# Patient Record
Sex: Male | Born: 1950
Health system: Southern US, Community
[De-identification: ages and names within clinical notes are randomized; demographics above are authoritative.]

## PROBLEM LIST (undated history)

## (undated) DIAGNOSIS — M75122 Complete rotator cuff tear or rupture of left shoulder, not specified as traumatic: Secondary | ICD-10-CM

## (undated) DIAGNOSIS — M7502 Adhesive capsulitis of left shoulder: Secondary | ICD-10-CM

## (undated) DIAGNOSIS — N400 Enlarged prostate without lower urinary tract symptoms: Secondary | ICD-10-CM

## (undated) DIAGNOSIS — I10 Essential (primary) hypertension: Secondary | ICD-10-CM

## (undated) HISTORY — DX: Benign prostatic hyperplasia without lower urinary tract symptoms: N40.0

## (undated) HISTORY — PX: BACK SURGERY: SHX140

---

## 2007-08-10 ENCOUNTER — Encounter: Admission: RE | Admit: 2007-08-10 | Discharge: 2007-08-10 | Payer: Self-pay | Admitting: Neurosurgery

## 2007-08-23 ENCOUNTER — Ambulatory Visit (HOSPITAL_COMMUNITY): Admission: RE | Admit: 2007-08-23 | Discharge: 2007-08-24 | Payer: Self-pay | Admitting: Neurosurgery

## 2010-12-19 ENCOUNTER — Encounter: Payer: Self-pay | Admitting: Neurosurgery

## 2011-04-11 NOTE — Op Note (Signed)
NAME:  Christopher Maldonado, Christopher Maldonado NO.:  [id number]   MEDICAL RECORD NO.:  [id number]          PATIENT TYPE:  AMB   LOCATION:  SDS                          FACILITY:  [facility]   PHYSICIAN:  Henry A. Pool, M.D.    DATE OF BIRTH:  [birth year]   DATE OF PROCEDURE:  08/23/2007  DATE OF DISCHARGE:                               OPERATIVE REPORT   SERVICE:  Neurosurgery.   PREOPERATIVE DIAGNOSIS:  Right L5-S1 herniated pulposus with  radiculopathy.   POSTOPERATIVE DIAGNOSIS:  Right L5-S1 herniated pulposus with  radiculopathy.   PROCEDURE:  Right L5-S1 laminotomy and microdiskectomy.   SURGEON:  Kathaleen Maser. Pool, MD   ASSISTANT:  Tia Alert, MD   ANESTHESIA:  General endotracheal.   INDICATIONS:  Mr. Cranshaw is a 60 year old male with a history of back and  right lower extremity pain, paresthesia and weakness consistent with a  right-sided S1 radiculopathy.  Workup demonstrates evidence of focal  right-sided L5-S1 disk herniation with compression of the right-sided S1  nerve root.  Patient has failed conservative management and presents now  for laminotomy and microdiskectomy in hopes of improving his symptoms.   OPERATIVE NOTE:  The patient was brought to the operating room and  placed on the table in a supine position.  After an adequate level of  anesthesia was achieved, the patient was positioned prone on a Wilson  frame and appropriately padded.  The patient's lumbar region was prepped  and draped sterilely.  A 10 blade was then used to make a linear  incision overlying the L5-S1 interspace.  This was carried down sharply  in midline.  Subperiosteal dissection was performed, exposing the lamina  and facet joints at L5 and S1 on the right side.  A deep self-retaining  retractor was placed and intraoperative x-rays taken and the level was  confirmed.  A laminotomy was then performed using a high-speed drill and  Kerrison rongeurs to remove the inferior aspect of the lamina  of L5,  medial aspect of L5-S1 facet joint and superior rim of the S1 lamina.  The ligament flavum was then elevated and resected in piecemeal fashion  using Kerrison rongeur rongeurs.  The underlying thecal sac and exiting  S1 nerve root were identified.  The microscopic was then brought into  the field and used for microdissection of the right-sided S1 nerve root  and underlying disk herniation.  The epidural venous plexus was  coagulated and cut.  Thecal sac and S1 nerve root were gently mobilized  and tracked towards the midline.  Disk herniation was readily apparent  with a moderately large free fragment causing marked compression of the  right-sided S1 nerve root.  This was dissected free and completely  resected using blunt nerve hooks and pituitary rongeurs.  The residual  disk space itself was flat.  There was a small annular defect, but no  evidence of significantly loose or threateningly degenerative disk  protruding from the annular tear; for this reason, a full annulotomy was  not performed and an intradiskal diskectomy was not performed.  The  wound was  then irrigated with antibiotic solution.  Gelfoam was placed  topically for hemostasis, which was found to be good.  Microscope and  retractor system were removed.  Hemostasis in the  muscle was achieved with electrocautery.  The wound was then closed in  layers with Vicryl sutures.  Steri-Strips and sterile dressings were  applied.  There were no apparent complications.  The patient tolerated  the procedure well and he returns to the recovery room postoperatively.           ______________________________  Kathaleen Maser Pool, M.D.     HAP/MEDQ  D:  08/23/2007  T:  08/24/2007  Job:  [id number]

## 2011-09-07 LAB — CBC
HCT: 46
Hemoglobin: 15.7
MCHC: 34.1
Platelets: 154
RDW: 12.6

## 2011-09-07 LAB — DIFFERENTIAL
Basophils Absolute: 0
Basophils Relative: 0
Eosinophils Relative: 4
Monocytes Absolute: 0.5
Neutro Abs: 3.9

## 2014-11-24 ENCOUNTER — Other Ambulatory Visit: Payer: Self-pay | Admitting: Family Medicine

## 2014-11-24 DIAGNOSIS — R17 Unspecified jaundice: Secondary | ICD-10-CM

## 2014-11-25 ENCOUNTER — Other Ambulatory Visit: Payer: Self-pay

## 2014-11-27 HISTORY — PX: HERNIA REPAIR: SHX51

## 2016-09-13 DIAGNOSIS — D229 Melanocytic nevi, unspecified: Secondary | ICD-10-CM | POA: Diagnosis not present

## 2016-09-13 DIAGNOSIS — N400 Enlarged prostate without lower urinary tract symptoms: Secondary | ICD-10-CM | POA: Diagnosis not present

## 2016-09-13 DIAGNOSIS — Z0000 Encounter for general adult medical examination without abnormal findings: Secondary | ICD-10-CM | POA: Diagnosis not present

## 2016-09-13 DIAGNOSIS — I10 Essential (primary) hypertension: Secondary | ICD-10-CM | POA: Diagnosis not present

## 2016-09-13 DIAGNOSIS — E785 Hyperlipidemia, unspecified: Secondary | ICD-10-CM | POA: Diagnosis not present

## 2016-10-12 DIAGNOSIS — D225 Melanocytic nevi of trunk: Secondary | ICD-10-CM | POA: Diagnosis not present

## 2016-10-12 DIAGNOSIS — L814 Other melanin hyperpigmentation: Secondary | ICD-10-CM | POA: Diagnosis not present

## 2016-10-12 DIAGNOSIS — D485 Neoplasm of uncertain behavior of skin: Secondary | ICD-10-CM | POA: Diagnosis not present

## 2016-10-12 DIAGNOSIS — L821 Other seborrheic keratosis: Secondary | ICD-10-CM | POA: Diagnosis not present

## 2016-10-12 DIAGNOSIS — L570 Actinic keratosis: Secondary | ICD-10-CM | POA: Diagnosis not present

## 2016-11-14 DIAGNOSIS — D485 Neoplasm of uncertain behavior of skin: Secondary | ICD-10-CM | POA: Diagnosis not present

## 2016-11-15 DIAGNOSIS — D225 Melanocytic nevi of trunk: Secondary | ICD-10-CM | POA: Diagnosis not present

## 2016-11-15 DIAGNOSIS — L900 Lichen sclerosus et atrophicus: Secondary | ICD-10-CM | POA: Diagnosis not present

## 2017-10-03 DIAGNOSIS — N400 Enlarged prostate without lower urinary tract symptoms: Secondary | ICD-10-CM | POA: Diagnosis not present

## 2017-10-03 DIAGNOSIS — I10 Essential (primary) hypertension: Secondary | ICD-10-CM | POA: Diagnosis not present

## 2017-10-11 DIAGNOSIS — D225 Melanocytic nevi of trunk: Secondary | ICD-10-CM | POA: Diagnosis not present

## 2017-10-11 DIAGNOSIS — Z872 Personal history of diseases of the skin and subcutaneous tissue: Secondary | ICD-10-CM | POA: Diagnosis not present

## 2017-10-11 DIAGNOSIS — L814 Other melanin hyperpigmentation: Secondary | ICD-10-CM | POA: Diagnosis not present

## 2017-10-11 DIAGNOSIS — L570 Actinic keratosis: Secondary | ICD-10-CM | POA: Diagnosis not present

## 2018-04-08 DIAGNOSIS — E785 Hyperlipidemia, unspecified: Secondary | ICD-10-CM | POA: Diagnosis not present

## 2018-04-08 DIAGNOSIS — I10 Essential (primary) hypertension: Secondary | ICD-10-CM | POA: Diagnosis not present

## 2018-04-08 DIAGNOSIS — Z125 Encounter for screening for malignant neoplasm of prostate: Secondary | ICD-10-CM | POA: Diagnosis not present

## 2018-04-11 DIAGNOSIS — E785 Hyperlipidemia, unspecified: Secondary | ICD-10-CM | POA: Diagnosis not present

## 2018-04-11 DIAGNOSIS — I10 Essential (primary) hypertension: Secondary | ICD-10-CM | POA: Diagnosis not present

## 2018-04-11 DIAGNOSIS — Z23 Encounter for immunization: Secondary | ICD-10-CM | POA: Diagnosis not present

## 2018-04-11 DIAGNOSIS — Z0000 Encounter for general adult medical examination without abnormal findings: Secondary | ICD-10-CM | POA: Diagnosis not present

## 2018-04-11 DIAGNOSIS — N400 Enlarged prostate without lower urinary tract symptoms: Secondary | ICD-10-CM | POA: Diagnosis not present

## 2018-06-17 DIAGNOSIS — H52203 Unspecified astigmatism, bilateral: Secondary | ICD-10-CM | POA: Diagnosis not present

## 2018-06-17 DIAGNOSIS — H524 Presbyopia: Secondary | ICD-10-CM | POA: Diagnosis not present

## 2018-06-17 DIAGNOSIS — H2513 Age-related nuclear cataract, bilateral: Secondary | ICD-10-CM | POA: Diagnosis not present

## 2018-12-04 DIAGNOSIS — M5412 Radiculopathy, cervical region: Secondary | ICD-10-CM | POA: Diagnosis not present

## 2018-12-12 ENCOUNTER — Other Ambulatory Visit: Payer: Self-pay | Admitting: Family Medicine

## 2018-12-12 DIAGNOSIS — M542 Cervicalgia: Secondary | ICD-10-CM | POA: Diagnosis not present

## 2018-12-12 DIAGNOSIS — M5410 Radiculopathy, site unspecified: Secondary | ICD-10-CM

## 2018-12-12 DIAGNOSIS — R29898 Other symptoms and signs involving the musculoskeletal system: Secondary | ICD-10-CM | POA: Diagnosis not present

## 2018-12-13 ENCOUNTER — Other Ambulatory Visit: Payer: Self-pay

## 2018-12-16 ENCOUNTER — Ambulatory Visit
Admission: RE | Admit: 2018-12-16 | Discharge: 2018-12-16 | Disposition: A | Discharge status: Home / Self Care | Payer: BLUE CROSS/BLUE SHIELD | Source: Ambulatory Visit | Attending: Family Medicine | Admitting: Family Medicine

## 2018-12-16 DIAGNOSIS — M4802 Spinal stenosis, cervical region: Secondary | ICD-10-CM | POA: Diagnosis not present

## 2018-12-16 DIAGNOSIS — M5410 Radiculopathy, site unspecified: Secondary | ICD-10-CM

## 2018-12-25 DIAGNOSIS — M4802 Spinal stenosis, cervical region: Secondary | ICD-10-CM | POA: Diagnosis not present

## 2018-12-25 DIAGNOSIS — I10 Essential (primary) hypertension: Secondary | ICD-10-CM | POA: Diagnosis not present

## 2018-12-25 DIAGNOSIS — Z01818 Encounter for other preprocedural examination: Secondary | ICD-10-CM | POA: Diagnosis not present

## 2018-12-25 DIAGNOSIS — Z6827 Body mass index (BMI) 27.0-27.9, adult: Secondary | ICD-10-CM | POA: Diagnosis not present

## 2018-12-28 HISTORY — PX: CERVICAL FUSION: SHX112

## 2018-12-30 DIAGNOSIS — M50021 Cervical disc disorder at C4-C5 level with myelopathy: Secondary | ICD-10-CM | POA: Diagnosis not present

## 2018-12-30 DIAGNOSIS — M4802 Spinal stenosis, cervical region: Secondary | ICD-10-CM | POA: Diagnosis not present

## 2018-12-30 DIAGNOSIS — M4712 Other spondylosis with myelopathy, cervical region: Secondary | ICD-10-CM | POA: Diagnosis not present

## 2019-01-30 DIAGNOSIS — M4802 Spinal stenosis, cervical region: Secondary | ICD-10-CM | POA: Diagnosis not present

## 2019-05-01 DIAGNOSIS — S46012A Strain of muscle(s) and tendon(s) of the rotator cuff of left shoulder, initial encounter: Secondary | ICD-10-CM | POA: Diagnosis not present

## 2019-05-06 DIAGNOSIS — M25512 Pain in left shoulder: Secondary | ICD-10-CM | POA: Diagnosis not present

## 2019-05-08 DIAGNOSIS — S46012D Strain of muscle(s) and tendon(s) of the rotator cuff of left shoulder, subsequent encounter: Secondary | ICD-10-CM | POA: Diagnosis not present

## 2019-05-15 DIAGNOSIS — S46012D Strain of muscle(s) and tendon(s) of the rotator cuff of left shoulder, subsequent encounter: Secondary | ICD-10-CM | POA: Diagnosis not present

## 2019-05-21 ENCOUNTER — Other Ambulatory Visit: Payer: Self-pay | Admitting: Orthopedic Surgery

## 2019-05-27 ENCOUNTER — Other Ambulatory Visit: Payer: Self-pay

## 2019-05-27 ENCOUNTER — Encounter (HOSPITAL_BASED_OUTPATIENT_CLINIC_OR_DEPARTMENT_OTHER): Payer: Self-pay | Admitting: Physician Assistant

## 2019-05-27 DIAGNOSIS — R9431 Abnormal electrocardiogram [ECG] [EKG]: Secondary | ICD-10-CM | POA: Diagnosis not present

## 2019-05-27 DIAGNOSIS — Z01818 Encounter for other preprocedural examination: Secondary | ICD-10-CM | POA: Diagnosis not present

## 2019-05-27 NOTE — Progress Notes (Signed)
Called Dr Hal Hope Smith's office and left message for Medical Records requesting EKG done today at their office to be faxed to [facility].

## 2019-05-27 NOTE — Progress Notes (Signed)
When completing the pre op interview pt stated that he had just had an EKG at his PCP about 2 hours ago. He stated that his PCP was questionable about the results and was going to consult a cardiologist to see if he needed to follow up with a cards MD or if it looked ok. He has no other previous EKG's to compare it with. I informed the pt that I would still like him to come in for his PAT EKG with [place] BUT that if FOR ANY REASON his PCP decided to send him to see a cardiologist he needed to call us ASAP so that we could consult with anesthesiologist. Will pass this on to [place].

## 2019-05-28 ENCOUNTER — Encounter: Payer: Self-pay | Admitting: Cardiology

## 2019-05-28 ENCOUNTER — Ambulatory Visit (INDEPENDENT_AMBULATORY_CARE_PROVIDER_SITE_OTHER): Payer: BLUE CROSS/BLUE SHIELD | Admitting: Cardiology

## 2019-05-28 ENCOUNTER — Other Ambulatory Visit: Payer: Self-pay

## 2019-05-28 VITALS — BP 163/93 | HR 84 | Ht 72.0 in | Wt 207.0 lb

## 2019-05-28 DIAGNOSIS — Z87891 Personal history of nicotine dependence: Secondary | ICD-10-CM

## 2019-05-28 DIAGNOSIS — Z01810 Encounter for preprocedural cardiovascular examination: Secondary | ICD-10-CM

## 2019-05-28 DIAGNOSIS — I358 Other nonrheumatic aortic valve disorders: Secondary | ICD-10-CM

## 2019-05-28 DIAGNOSIS — R9431 Abnormal electrocardiogram [ECG] [EKG]: Secondary | ICD-10-CM | POA: Diagnosis not present

## 2019-05-28 DIAGNOSIS — N400 Enlarged prostate without lower urinary tract symptoms: Secondary | ICD-10-CM | POA: Insufficient documentation

## 2019-05-28 DIAGNOSIS — I10 Essential (primary) hypertension: Secondary | ICD-10-CM | POA: Diagnosis not present

## 2019-05-28 NOTE — Progress Notes (Signed)
Primary Physician:  Carol Ada, MD   Patient ID: Christopher Maldonado, male    DOB: 19-Jul-1951, 68 y.o.   MRN: [id number]  Subjective:    Chief Complaint  Patient presents with  . Abnormal ECG    pre op    HPI: [name]  is a 68 y.o. male  with hypertension, former smoker that quit in Feb 2020, enlarged prostate, OA referred to [place] on STAT basis for evaluation of abnormal EKG and for perioperative cardiac risk stratification for left shoulder arthroscopy with rotator cuff repair scheduled for 06/02/19.   Reports he had neck surgery in Feb 2020 without any complications. He also had hernia repair a few years ago. Reports that EKG was recently performed and noted to be abnormal.   In Dec, he injured his left shoulder playing golf and has not been quite as active since. But he does do some activities around his house and is currently building a green house for his daughter. He denies any chest pain. Does notice shortness of breath with over exertion. No leg swelling, palpitations, PND, orthopnea, or symptoms of claudication or TIA.   He has recently noted that his blood pressure is elevated.   Past Medical History:  Diagnosis Date  . Enlarged prostate   . Hypertension     Past Surgical History:  Procedure Laterality Date  . BACK SURGERY    . CERVICAL FUSION  12/2018  . HERNIA REPAIR  2016    Social History   Socioeconomic History  . Marital status: Married    Spouse name: Not on file  . Number of children: 4  . Years of education: Not on file  . Highest education level: Not on file  Occupational History  . Not on file  Social Needs  . Financial resource strain: Not on file  . Food insecurity    Worry: Not on file    Inability: Not on file  . Transportation needs    Medical: Not on file    Non-medical: Not on file  Tobacco Use  . Smoking status: Former Research scientist (life sciences)  . Smokeless tobacco: Never Used  . Tobacco comment: quit Feb 2020  Substance and Sexual Activity   . Alcohol use: Yes    Alcohol/week: 3.0 standard drinks    Types: 3 Cans of beer per week    Comment: every oher day  . Drug use: Not Currently  . Sexual activity: Not on file  Lifestyle  . Physical activity    Days per week: Not on file    Minutes per session: Not on file  . Stress: Not on file  Relationships  . Social Herbalist on phone: Not on file    Gets together: Not on file    Attends religious service: Not on file    Active member of club or organization: Not on file    Attends meetings of clubs or organizations: Not on file    Relationship status: Not on file  . Intimate partner violence    Fear of current or ex partner: Not on file    Emotionally abused: Not on file    Physically abused: Not on file    Forced sexual activity: Not on file  Other Topics Concern  . Not on file  Social History Narrative  . Not on file    Review of Systems  Constitution: Negative for decreased appetite, malaise/fatigue, weight gain and weight loss.  Eyes: Negative for visual disturbance.  Cardiovascular: Positive for dyspnea on exertion. Negative for chest pain, claudication, leg swelling, orthopnea, palpitations and syncope.  Respiratory: Negative for hemoptysis and wheezing.   Endocrine: Negative for cold intolerance and heat intolerance.  Hematologic/Lymphatic: Does not bruise/bleed easily.  Skin: Negative for nail changes.  Musculoskeletal: Negative for muscle weakness and myalgias.  Gastrointestinal: Negative for abdominal pain, change in bowel habit, nausea and vomiting.  Neurological: Negative for difficulty with concentration, dizziness, focal weakness and headaches.  Psychiatric/Behavioral: Negative for altered mental status and suicidal ideas.  All other systems reviewed and are negative.     Objective:  Blood pressure (!) 163/93, pulse 84, height 6' (1.829 m), weight 207 lb (93.9 kg), SpO2 96 %. Body mass index is 28.07 kg/m.    Physical Exam   Constitutional: He is oriented to person, place, and time. Vital signs are normal. He appears well-developed and well-nourished.  HENT:  Head: Normocephalic and atraumatic.  Neck: Normal range of motion.  Cardiovascular: Normal rate, regular rhythm and intact distal pulses.  Murmur heard.  Early systolic murmur is present with a grade of 2/6 at the upper right sternal border. Pulses:      Femoral pulses are 2+ on the right side and 2+ on the left side.      Popliteal pulses are 2+ on the right side and 2+ on the left side.       Dorsalis pedis pulses are 2+ on the right side and 2+ on the left side.       Posterior tibial pulses are 2+ on the right side and 2+ on the left side.  Pulmonary/Chest: Effort normal and breath sounds normal. No accessory muscle usage. No respiratory distress.  Abdominal: Soft. Bowel sounds are normal.  Musculoskeletal: Normal range of motion.  Neurological: He is alert and oriented to person, place, and time.  Skin: Skin is warm and dry.  Vitals reviewed.  Radiology: No results found.  Laboratory examination:   04/08/2018: Cholesterol 184, triglycerides 167, HDL 43, LDL 107. Creatinine 0.95, eGFR 79/96, potassium 5.1, CMP normal. Normal H and H, Plt 135, CBC otherwise normal.   No flowsheet data found. CBC 08/19/2007  WBC 6.0  Hemoglobin 15.7  Hematocrit 46.0  Platelets 154   Lipid Panel  No results found for: CHOL, TRIG, HDL, CHOLHDL, VLDL, LDLCALC, LDLDIRECT HEMOGLOBIN A1C No results found for: HGBA1C, MPG TSH No results for input(s): TSH in the last 8760 hours.  PRN Meds:. There are no discontinued medications. Current Meds  Medication Sig  . doxazosin (CARDURA) 4 MG tablet   . finasteride (PROSCAR) 5 MG tablet   . lisinopril (ZESTRIL) 20 MG tablet   . Multiple Vitamin (MULTIVITAMIN) tablet Take 1 tablet by mouth daily.    Cardiac Studies:   Echocardiogram 05/29/2019: Normal LV systolic function with EF 57%. Left ventricle cavity is  normal in size. Moderate concentric hypertrophy of the left ventricle. Normal global wall motion. Doppler evidence of grade I (impaired) diastolic dysfunction, normal LAP. Calculated EF 57%. Mild (Grade I) mitral regurgitation. Normal right atrial pressure.  Assessment:     ICD-10-CM   1. Preoperative cardiovascular examination  Z01.810   2. Abnormal EKG  R94.31 EKG 12-Lead  3. Essential hypertension  I10   4. Former smoker  Z87.891   5. Aortic systolic murmur on examination  I35.8     EKG 05/28/2019: Normal sinus rhythm at 83 bpm, left atrial enlargement, left axis deviation, left anterior fasicular block, non-specific T wave abnormality, cannot exclude ischemia.  Recommendations:   Patient referred to [place] for evaluation of abnormal EKG and for perioperative cardiac or stratification.  Patient is essentially asymptomatic except for some shortness of breath with overexertion.  He has good functional capacity.  Do not feel that patient will require stress testing prior to surgery as he is likely low risk.  Will obtain echocardiogram to be performed on stat basis and unless markedly abnormal, he can proceed with surgery low risk for perioperative CV complication.  I suspect EKG abnormalities are related to hypertension.  His blood pressure is markedly elevated in the office today, will have this reevaluated tomorrow after his echocardiogram and if continues to be elevated, will add additional antihypertensive therapy.  No clinical evidence of heart failure by physical exam.  Unless echocardiogram is markedly abnormal, we will see him back on a as needed basis.   *I have discussed this case with Dr. Virgina Jock and he personally examined the patient and participated in formulating the plan.*    *Addendum: Echo reveals normal LVEF, no wall motion abnormalities. Patient can proceed with surgery and will send clearance letter to Dr. Noemi Chapel. Blood pressure continue to be elevated. Will start  amlodipine 5 mg daily. Will have him follow up in 4-6 weeks to follow up on hypertension.   Miquel Dunn, MSN, APRN, FNP-C [facility]. [place] Office: [phone] Fax: [phone]

## 2019-05-29 ENCOUNTER — Ambulatory Visit (INDEPENDENT_AMBULATORY_CARE_PROVIDER_SITE_OTHER): Payer: BLUE CROSS/BLUE SHIELD

## 2019-05-29 ENCOUNTER — Other Ambulatory Visit: Payer: Self-pay

## 2019-05-29 ENCOUNTER — Other Ambulatory Visit (HOSPITAL_COMMUNITY)
Admission: RE | Admit: 2019-05-29 | Discharge: 2019-05-29 | Disposition: A | Discharge status: Home / Self Care | Payer: BLUE CROSS/BLUE SHIELD | Source: Ambulatory Visit | Attending: Orthopedic Surgery | Admitting: Orthopedic Surgery

## 2019-05-29 ENCOUNTER — Ambulatory Visit (INDEPENDENT_AMBULATORY_CARE_PROVIDER_SITE_OTHER): Payer: BLUE CROSS/BLUE SHIELD | Admitting: Cardiology

## 2019-05-29 ENCOUNTER — Encounter: Payer: Self-pay | Admitting: Cardiology

## 2019-05-29 VITALS — BP 156/94 | HR 73

## 2019-05-29 DIAGNOSIS — I10 Essential (primary) hypertension: Secondary | ICD-10-CM

## 2019-05-29 DIAGNOSIS — I358 Other nonrheumatic aortic valve disorders: Secondary | ICD-10-CM

## 2019-05-29 DIAGNOSIS — Z01810 Encounter for preprocedural cardiovascular examination: Secondary | ICD-10-CM | POA: Diagnosis not present

## 2019-05-29 DIAGNOSIS — Z1159 Encounter for screening for other viral diseases: Secondary | ICD-10-CM | POA: Diagnosis not present

## 2019-05-29 DIAGNOSIS — R9431 Abnormal electrocardiogram [ECG] [EKG]: Secondary | ICD-10-CM

## 2019-05-29 LAB — SARS CORONAVIRUS 2 (TAT 6-24 HRS): SARS Coronavirus 2: NEGATIVE

## 2019-05-29 MED ORDER — AMLODIPINE BESYLATE 5 MG PO TABS: 5.0000 mg | ORAL_TABLET | Freq: Every day | ORAL | 1 refills | Status: AC

## 2019-05-30 ENCOUNTER — Other Ambulatory Visit (HOSPITAL_COMMUNITY): Payer: BLUE CROSS/BLUE SHIELD

## 2019-06-02 ENCOUNTER — Other Ambulatory Visit: Payer: Self-pay

## 2019-06-02 ENCOUNTER — Encounter (HOSPITAL_BASED_OUTPATIENT_CLINIC_OR_DEPARTMENT_OTHER): Payer: Self-pay | Admitting: Anesthesiology

## 2019-06-02 ENCOUNTER — Ambulatory Visit (HOSPITAL_BASED_OUTPATIENT_CLINIC_OR_DEPARTMENT_OTHER): Payer: BLUE CROSS/BLUE SHIELD | Admitting: Anesthesiology

## 2019-06-02 ENCOUNTER — Ambulatory Visit (HOSPITAL_BASED_OUTPATIENT_CLINIC_OR_DEPARTMENT_OTHER)
Admission: RE | Admit: 2019-06-02 | Discharge: 2019-06-02 | Disposition: A | Discharge status: Home / Self Care | Payer: BLUE CROSS/BLUE SHIELD | Attending: Orthopedic Surgery | Admitting: Orthopedic Surgery

## 2019-06-02 ENCOUNTER — Encounter (HOSPITAL_BASED_OUTPATIENT_CLINIC_OR_DEPARTMENT_OTHER)
Admission: RE | Disposition: A | Discharge status: Home / Self Care | Payer: Self-pay | Source: Home / Self Care | Attending: Orthopedic Surgery

## 2019-06-02 DIAGNOSIS — Z87891 Personal history of nicotine dependence: Secondary | ICD-10-CM | POA: Diagnosis not present

## 2019-06-02 DIAGNOSIS — N400 Enlarged prostate without lower urinary tract symptoms: Secondary | ICD-10-CM | POA: Diagnosis not present

## 2019-06-02 DIAGNOSIS — G8918 Other acute postprocedural pain: Secondary | ICD-10-CM | POA: Diagnosis not present

## 2019-06-02 DIAGNOSIS — M24112 Other articular cartilage disorders, left shoulder: Secondary | ICD-10-CM | POA: Diagnosis not present

## 2019-06-02 DIAGNOSIS — M7502 Adhesive capsulitis of left shoulder: Secondary | ICD-10-CM | POA: Diagnosis present

## 2019-06-02 DIAGNOSIS — S43432A Superior glenoid labrum lesion of left shoulder, initial encounter: Secondary | ICD-10-CM | POA: Insufficient documentation

## 2019-06-02 DIAGNOSIS — I10 Essential (primary) hypertension: Secondary | ICD-10-CM | POA: Diagnosis not present

## 2019-06-02 DIAGNOSIS — S46012A Strain of muscle(s) and tendon(s) of the rotator cuff of left shoulder, initial encounter: Secondary | ICD-10-CM | POA: Diagnosis not present

## 2019-06-02 DIAGNOSIS — M75122 Complete rotator cuff tear or rupture of left shoulder, not specified as traumatic: Secondary | ICD-10-CM | POA: Diagnosis present

## 2019-06-02 DIAGNOSIS — M25812 Other specified joint disorders, left shoulder: Secondary | ICD-10-CM | POA: Insufficient documentation

## 2019-06-02 DIAGNOSIS — M75102 Unspecified rotator cuff tear or rupture of left shoulder, not specified as traumatic: Secondary | ICD-10-CM | POA: Diagnosis not present

## 2019-06-02 DIAGNOSIS — X58XXXA Exposure to other specified factors, initial encounter: Secondary | ICD-10-CM | POA: Diagnosis not present

## 2019-06-02 DIAGNOSIS — M7542 Impingement syndrome of left shoulder: Secondary | ICD-10-CM | POA: Diagnosis not present

## 2019-06-02 HISTORY — DX: Complete rotator cuff tear or rupture of left shoulder, not specified as traumatic: M75.122

## 2019-06-02 HISTORY — DX: Adhesive capsulitis of left shoulder: M75.02

## 2019-06-02 HISTORY — DX: Essential (primary) hypertension: I10

## 2019-06-02 HISTORY — PX: SHOULDER ARTHROSCOPY WITH ROTATOR CUFF REPAIR: SHX5685

## 2019-06-02 SURGERY — ARTHROSCOPY, SHOULDER, WITH ROTATOR CUFF REPAIR
Anesthesia: Regional | Site: Shoulder | Laterality: Left

## 2019-06-02 MED ORDER — GLYCOPYRROLATE PF 0.2 MG/ML IJ SOSY
PREFILLED_SYRINGE | INTRAMUSCULAR | Status: AC
  Filled 2019-06-02: qty 1

## 2019-06-02 MED ORDER — PROPOFOL 10 MG/ML IV BOLUS
INTRAVENOUS | Status: AC
  Filled 2019-06-02: qty 20

## 2019-06-02 MED ORDER — FENTANYL CITRATE (PF) 100 MCG/2ML IJ SOLN
50.0000 ug | Freq: Once | INTRAMUSCULAR | Status: AC
  Administered 2019-06-02: 50 ug via INTRAVENOUS

## 2019-06-02 MED ORDER — CEFAZOLIN SODIUM-DEXTROSE 2-4 GM/100ML-% IV SOLN
2.0000 g | INTRAVENOUS | Status: AC
  Administered 2019-06-02: 14:00:00 2 g via INTRAVENOUS

## 2019-06-02 MED ORDER — GLYCOPYRROLATE 0.2 MG/ML IJ SOLN
INTRAMUSCULAR | Status: DC | PRN
  Administered 2019-06-02: 0.1 mg via INTRAVENOUS

## 2019-06-02 MED ORDER — CEFAZOLIN SODIUM-DEXTROSE 2-4 GM/100ML-% IV SOLN
INTRAVENOUS | Status: AC
  Filled 2019-06-02: qty 100

## 2019-06-02 MED ORDER — ROCURONIUM BROMIDE 100 MG/10ML IV SOLN
INTRAVENOUS | Status: DC | PRN
  Administered 2019-06-02: 60 mg via INTRAVENOUS

## 2019-06-02 MED ORDER — DEXAMETHASONE SODIUM PHOSPHATE 4 MG/ML IJ SOLN
INTRAMUSCULAR | Status: DC | PRN
  Administered 2019-06-02: 10 mg via INTRAVENOUS

## 2019-06-02 MED ORDER — ONDANSETRON HCL 4 MG/2ML IJ SOLN
INTRAMUSCULAR | Status: DC | PRN
  Administered 2019-06-02: 4 mg via INTRAVENOUS

## 2019-06-02 MED ORDER — LIDOCAINE 2% (20 MG/ML) 5 ML SYRINGE
INTRAMUSCULAR | Status: DC | PRN
  Administered 2019-06-02: 50 mg via INTRAVENOUS

## 2019-06-02 MED ORDER — ONDANSETRON HCL 4 MG/2ML IJ SOLN: 4.0000 mg | Freq: Once | INTRAMUSCULAR | Status: DC | PRN

## 2019-06-02 MED ORDER — BUPIVACAINE LIPOSOME 1.3 % IJ SUSP
INTRAMUSCULAR | Status: DC | PRN
  Administered 2019-06-02: 10 mL via PERINEURAL

## 2019-06-02 MED ORDER — MIDAZOLAM HCL 5 MG/5ML IJ SOLN
INTRAMUSCULAR | Status: DC | PRN
  Administered 2019-06-02: 2 mg via INTRAVENOUS

## 2019-06-02 MED ORDER — BUPIVACAINE HCL (PF) 0.5 % IJ SOLN
INTRAMUSCULAR | Status: DC | PRN
  Administered 2019-06-02: 15 mL via PERINEURAL

## 2019-06-02 MED ORDER — EPHEDRINE SULFATE 50 MG/ML IJ SOLN
INTRAMUSCULAR | Status: DC | PRN
  Administered 2019-06-02 (×2): 25 mg via INTRAVENOUS

## 2019-06-02 MED ORDER — FENTANYL CITRATE (PF) 100 MCG/2ML IJ SOLN
INTRAMUSCULAR | Status: AC
  Filled 2019-06-02: qty 2

## 2019-06-02 MED ORDER — LACTATED RINGERS IV SOLN
INTRAVENOUS | Status: DC
  Administered 2019-06-02 (×2): via INTRAVENOUS

## 2019-06-02 MED ORDER — MIDAZOLAM HCL 2 MG/2ML IJ SOLN
INTRAMUSCULAR | Status: AC
  Filled 2019-06-02: qty 2

## 2019-06-02 MED ORDER — DEXAMETHASONE SODIUM PHOSPHATE 10 MG/ML IJ SOLN: 8.0000 mg | Freq: Once | INTRAMUSCULAR | Status: DC

## 2019-06-02 MED ORDER — ONDANSETRON HCL 4 MG/2ML IJ SOLN
INTRAMUSCULAR | Status: AC
  Filled 2019-06-02: qty 2

## 2019-06-02 MED ORDER — MIDAZOLAM HCL 2 MG/2ML IJ SOLN
1.0000 mg | Freq: Once | INTRAMUSCULAR | Status: AC
  Administered 2019-06-02: 1 mg via INTRAVENOUS

## 2019-06-02 MED ORDER — FENTANYL CITRATE (PF) 100 MCG/2ML IJ SOLN: 25.0000 ug | INTRAMUSCULAR | Status: DC | PRN

## 2019-06-02 MED ORDER — SUCCINYLCHOLINE CHLORIDE 200 MG/10ML IV SOSY
PREFILLED_SYRINGE | INTRAVENOUS | Status: AC
  Filled 2019-06-02: qty 10

## 2019-06-02 MED ORDER — PHENYLEPHRINE HCL (PRESSORS) 10 MG/ML IV SOLN
INTRAVENOUS | Status: DC | PRN
  Administered 2019-06-02: 80 ug via INTRAVENOUS
  Administered 2019-06-02: 160 ug via INTRAVENOUS

## 2019-06-02 MED ORDER — ACETAMINOPHEN 500 MG PO TABS
ORAL_TABLET | ORAL | Status: AC
  Filled 2019-06-02: qty 2

## 2019-06-02 MED ORDER — POVIDONE-IODINE 10 % EX SWAB: 2.0000 "application " | Freq: Once | CUTANEOUS | Status: DC

## 2019-06-02 MED ORDER — OXYCODONE HCL 5 MG PO TABS: ORAL_TABLET | ORAL | 0 refills | Status: AC

## 2019-06-02 MED ORDER — FENTANYL CITRATE (PF) 100 MCG/2ML IJ SOLN
INTRAMUSCULAR | Status: DC | PRN
  Administered 2019-06-02: 100 ug via INTRAVENOUS

## 2019-06-02 MED ORDER — PHENYLEPHRINE HCL (PRESSORS) 10 MG/ML IV SOLN
INTRAVENOUS | Status: AC
  Filled 2019-06-02: qty 1

## 2019-06-02 MED ORDER — PROPOFOL 10 MG/ML IV BOLUS
INTRAVENOUS | Status: DC | PRN
  Administered 2019-06-02: 130 mg via INTRAVENOUS

## 2019-06-02 MED ORDER — CHLORHEXIDINE GLUCONATE 4 % EX LIQD: 60.0000 mL | Freq: Once | CUTANEOUS | Status: DC

## 2019-06-02 MED ORDER — SODIUM CHLORIDE 0.9 % IV SOLN
INTRAVENOUS | Status: DC | PRN
  Administered 2019-06-02: 15:00:00 25 ug/min via INTRAVENOUS

## 2019-06-02 MED ORDER — DEXAMETHASONE SODIUM PHOSPHATE 10 MG/ML IJ SOLN
INTRAMUSCULAR | Status: AC
  Filled 2019-06-02: qty 1

## 2019-06-02 MED ORDER — SUGAMMADEX SODIUM 500 MG/5ML IV SOLN
INTRAVENOUS | Status: AC
  Filled 2019-06-02: qty 10

## 2019-06-02 MED ORDER — POVIDONE-IODINE 7.5 % EX SOLN: Freq: Once | CUTANEOUS | Status: DC

## 2019-06-02 MED ORDER — LIDOCAINE 2% (20 MG/ML) 5 ML SYRINGE
INTRAMUSCULAR | Status: AC
  Filled 2019-06-02: qty 5

## 2019-06-02 MED ORDER — ACETAMINOPHEN 500 MG PO TABS
1000.0000 mg | ORAL_TABLET | Freq: Once | ORAL | Status: AC
  Administered 2019-06-02: 1000 mg via ORAL

## 2019-06-02 MED ORDER — SODIUM CHLORIDE 0.9 % IR SOLN
Status: DC | PRN
  Administered 2019-06-02: 2 mL

## 2019-06-02 MED ORDER — TIZANIDINE HCL 4 MG PO TABS: 4.0000 mg | ORAL_TABLET | Freq: Four times a day (QID) | ORAL | 0 refills | Status: AC | PRN

## 2019-06-02 MED ORDER — SUGAMMADEX SODIUM 200 MG/2ML IV SOLN
INTRAVENOUS | Status: DC | PRN
  Administered 2019-06-02: 200 mg via INTRAVENOUS

## 2019-06-02 SURGICAL SUPPLY — 82 items
ANCHOR SUT BIO SW 4.75X19.1 (Anchor) ×3 IMPLANT
BENZOIN TINCTURE PRP APPL 2/3 (GAUZE/BANDAGES/DRESSINGS) IMPLANT
BLADE EXCALIBUR 4.0X13 (MISCELLANEOUS) IMPLANT
BLADE SURG 15 STRL LF DISP TIS (BLADE) IMPLANT
BLADE SURG 15 STRL SS (BLADE)
BNDG COHESIVE 4X5 TAN STRL (GAUZE/BANDAGES/DRESSINGS) ×3 IMPLANT
BURR OVAL 8 FLU 5.0X13 (MISCELLANEOUS) ×3 IMPLANT
CANNULA TWIST IN 8.25X7CM (CANNULA) ×6 IMPLANT
COVER WAND RF STERILE (DRAPES) IMPLANT
DECANTER SPIKE VIAL GLASS SM (MISCELLANEOUS) IMPLANT
DISSECTOR  3.8MM X 13CM (MISCELLANEOUS) ×1
DISSECTOR 3.8MM X 13CM (MISCELLANEOUS) ×2 IMPLANT
DRAPE SHOULDER BEACH CHAIR (DRAPES) ×3 IMPLANT
DRAPE U-SHAPE 47X51 STRL (DRAPES) ×6 IMPLANT
DRSG PAD ABDOMINAL 8X10 ST (GAUZE/BANDAGES/DRESSINGS) ×3 IMPLANT
DURAPREP 26ML APPLICATOR (WOUND CARE) ×3 IMPLANT
ELECT REM PT RETURN 9FT ADLT (ELECTROSURGICAL) ×3
ELECTRODE REM PT RTRN 9FT ADLT (ELECTROSURGICAL) ×2 IMPLANT
GAUZE SPONGE 4X4 12PLY STRL (GAUZE/BANDAGES/DRESSINGS) ×3 IMPLANT
GAUZE XEROFORM 1X8 LF (GAUZE/BANDAGES/DRESSINGS) ×3 IMPLANT
GLOVE BIO SURGEON STRL SZ 6.5 (GLOVE) ×3 IMPLANT
GLOVE BIO SURGEON STRL SZ7 (GLOVE) IMPLANT
GLOVE BIOGEL PI IND STRL 6.5 (GLOVE) ×2 IMPLANT
GLOVE BIOGEL PI IND STRL 7.0 (GLOVE) IMPLANT
GLOVE BIOGEL PI IND STRL 7.5 (GLOVE) ×2 IMPLANT
GLOVE BIOGEL PI IND STRL 8 (GLOVE) ×2 IMPLANT
GLOVE BIOGEL PI INDICATOR 6.5 (GLOVE) ×1
GLOVE BIOGEL PI INDICATOR 7.0 (GLOVE)
GLOVE BIOGEL PI INDICATOR 7.5 (GLOVE) ×1
GLOVE BIOGEL PI INDICATOR 8 (GLOVE) ×1
GLOVE SS BIOGEL STRL SZ 7.5 (GLOVE) ×2 IMPLANT
GLOVE SUPERSENSE BIOGEL SZ 7.5 (GLOVE) ×1
GOWN STRL REUS W/ TWL LRG LVL3 (GOWN DISPOSABLE) ×4 IMPLANT
GOWN STRL REUS W/ TWL XL LVL3 (GOWN DISPOSABLE) ×2 IMPLANT
GOWN STRL REUS W/TWL LRG LVL3 (GOWN DISPOSABLE) ×2
GOWN STRL REUS W/TWL XL LVL3 (GOWN DISPOSABLE) ×1
LOOP 2 FIBERLINK CLOSED (SUTURE) IMPLANT
MANIFOLD NEPTUNE II (INSTRUMENTS) ×3 IMPLANT
NDL SAFETY ECLIPSE 18X1.5 (NEEDLE) ×2 IMPLANT
NDL SUT 6 .5 CRC .975X.05 MAYO (NEEDLE) IMPLANT
NEEDLE 1/2 CIR CATGUT .05X1.09 (NEEDLE) IMPLANT
NEEDLE HYPO 18GX1.5 SHARP (NEEDLE) ×1
NEEDLE MAYO TAPER (NEEDLE)
NEEDLE SCORPION MULTI FIRE (NEEDLE) ×3 IMPLANT
PACK ARTHROSCOPY DSU (CUSTOM PROCEDURE TRAY) ×3 IMPLANT
PACK BASIN DAY SURGERY FS (CUSTOM PROCEDURE TRAY) ×3 IMPLANT
PAD ALCOHOL SWAB (MISCELLANEOUS) ×6 IMPLANT
PENCIL BUTTON HOLSTER BLD 10FT (ELECTRODE) IMPLANT
PORT APPOLLO RF 90DEGREE MULTI (SURGICAL WAND) ×3 IMPLANT
RESTRAINT HEAD UNIVERSAL NS (MISCELLANEOUS) ×3 IMPLANT
SHEET MEDIUM DRAPE 40X70 STRL (DRAPES) IMPLANT
SLEEVE SCD COMPRESS KNEE MED (MISCELLANEOUS) ×3 IMPLANT
SLING ARM FOAM STRAP LRG (SOFTGOODS) IMPLANT
SLING ARM IMMOBILIZER MED (SOFTGOODS) IMPLANT
SLING ARM MED ADULT FOAM STRAP (SOFTGOODS) IMPLANT
SLING ARM XL FOAM STRAP (SOFTGOODS) IMPLANT
SLING ULTRA III MED (ORTHOPEDIC SUPPLIES) IMPLANT
SPONGE LAP 4X18 RFD (DISPOSABLE) IMPLANT
STRIP CLOSURE SKIN 1/2X4 (GAUZE/BANDAGES/DRESSINGS) IMPLANT
SUCTION FRAZIER HANDLE 10FR (MISCELLANEOUS)
SUCTION TUBE FRAZIER 10FR DISP (MISCELLANEOUS) IMPLANT
SUT ETHILON 3 0 PS 1 (SUTURE) ×3 IMPLANT
SUT FIBERWIRE #2 38 T-5 BLUE (SUTURE)
SUT PDS AB 2-0 CT2 27 (SUTURE) IMPLANT
SUT PROLENE 3 0 PS 2 (SUTURE) IMPLANT
SUT TIGER TAPE 7 IN WHITE (SUTURE) IMPLANT
SUT VIC AB 0 SH 27 (SUTURE) IMPLANT
SUT VIC AB 2-0 PS2 27 (SUTURE) IMPLANT
SUT VIC AB 2-0 SH 27 (SUTURE)
SUT VIC AB 2-0 SH 27XBRD (SUTURE) IMPLANT
SUTURE FIBERWR #2 38 T-5 BLUE (SUTURE) IMPLANT
SUTURE TAPE TIGERLINK 1.3MM BL (SUTURE) ×2 IMPLANT
SUTURETAPE TIGERLINK 1.3MM BL (SUTURE) ×3
SYR 5ML LL (SYRINGE) ×3 IMPLANT
SYR BULB 3OZ (MISCELLANEOUS) IMPLANT
TAPE FIBER 2MM 7IN #2 BLUE (SUTURE) IMPLANT
TAPE HYPAFIX 6X30 (GAUZE/BANDAGES/DRESSINGS) IMPLANT
TAPE STRIPS DRAPE STRL (GAUZE/BANDAGES/DRESSINGS) ×3 IMPLANT
TOWEL GREEN STERILE FF (TOWEL DISPOSABLE) ×3 IMPLANT
TUBE CONNECTING 20X1/4 (TUBING) IMPLANT
TUBING ARTHROSCOPY IRRIG 16FT (MISCELLANEOUS) ×3 IMPLANT
WATER STERILE IRR 1000ML POUR (IV SOLUTION) ×3 IMPLANT

## 2019-06-02 NOTE — Anesthesia Procedure Notes (Signed)
Procedure Name: Intubation Date/Time: 06/02/2019 2:21 PM Performed by: Marrianne Mood, CRNA Pre-anesthesia Checklist: Patient identified, Emergency Drugs available, Suction available, Patient being monitored and Timeout performed Patient Re-evaluated:Patient Re-evaluated prior to induction Oxygen Delivery Method: Circle system utilized Preoxygenation: Pre-oxygenation with 100% oxygen Induction Type: IV induction Ventilation: Mask ventilation without difficulty Laryngoscope Size: Miller and 3 Grade View: Grade II Tube type: Oral Tube size: 8.0 mm Number of attempts: 1 Airway Equipment and Method: Stylet and Oral airway Placement Confirmation: ETT inserted through vocal cords under direct vision,  positive ETCO2 and breath sounds checked- equal and bilateral Secured at: 23 cm Tube secured with: Tape Dental Injury: Teeth and Oropharynx as per pre-operative assessment

## 2019-06-02 NOTE — Brief Op Note (Signed)
06/02/2019  3:27 PM  PATIENT:  Christopher Maldonado  68 y.o. male  PRE-OPERATIVE DIAGNOSIS:  LEFT SHOULDER ADHESIVE CAPCULITIS, ROTATOR CUFF STRAIN  POST-OPERATIVE DIAGNOSIS:  LEFT SHOULDER ADHESIVE CAPCULITIS, ROTATOR CUF  PROCEDURE:  Procedure(s) with comments: LEFT SHOULDER ARTHROSCOPY WITH ROTATOR CUFF REPAIR, LYSIS RESECTION OF ADHESIONS WITH MANIPULATION, EXTENSIVE DEBRIDEMENT (Left) - PRE/POST OP SCALENE  SURGEON:  Surgeon(s) and Role:    Elsie Saas, MD - Primary  PHYSICIAN ASSISTANT: Chriss Czar, PA-C  ASSISTANTS:    ANESTHESIA:   regional and general  EBL:  minimal   BLOOD ADMINISTERED:none  DRAINS: none   LOCAL MEDICATIONS USED:  NONE  SPECIMEN:  No Specimen  DISPOSITION OF SPECIMEN:  N/A  COUNTS:  YES  TOURNIQUET:  * No tourniquets in log *  DICTATION: .Other Dictation: Dictation Number unknown  PLAN OF CARE: Discharge to home after PACU  PATIENT DISPOSITION:  PACU - hemodynamically stable.   Delay start of Pharmacological VTE agent (>24hrs) due to surgical blood loss or risk of bleeding: not applicable

## 2019-06-02 NOTE — Transfer of Care (Signed)
Immediate Anesthesia Transfer of Care Note  Patient: Christopher Maldonado  Procedure(s) Performed: LEFT SHOULDER ARTHROSCOPY WITH ROTATOR CUFF REPAIR, LYSIS RESECTION OF ADHESIONS WITH MANIPULATION, EXTENSIVE DEBRIDEMENT (Left Shoulder) SHOULDER ARTHROSCOPY WITH SUBACROMIAL DECOMPRESSION AND DISTAL CLAVICLE EXCISION (Shoulder)  Patient Location: PACU  Anesthesia Type:GA combined with regional for post-op pain  Level of Consciousness: awake, alert , oriented and patient cooperative  Airway & Oxygen Therapy: Patient Spontanous Breathing and Patient connected to nasal cannula oxygen  Post-op Assessment: Report given to RN and Post -op Vital signs reviewed and stable  Post vital signs: Reviewed and stable  Last Vitals:  Vitals Value Taken Time  BP    Temp    Pulse 94 06/02/19 1538  Resp    SpO2 97 % 06/02/19 1538  Vitals shown include unvalidated device data.  Last Pain:  Vitals:   06/02/19 1040  TempSrc: Oral  PainSc: 0-No pain         Complications: No apparent anesthesia complications

## 2019-06-02 NOTE — Anesthesia Postprocedure Evaluation (Signed)
Anesthesia Post Note  Patient: Christopher Maldonado  Procedure(s) Performed: LEFT SHOULDER ARTHROSCOPY WITH ROTATOR CUFF REPAIR, LYSIS RESECTION OF ADHESIONS WITH MANIPULATION, EXTENSIVE DEBRIDEMENT (Left Shoulder) SHOULDER ARTHROSCOPY WITH SUBACROMIAL DECOMPRESSION AND DISTAL CLAVICLE EXCISION (Shoulder)     Patient location during evaluation: PACU Anesthesia Type: Regional and General Level of consciousness: awake and alert Pain management: pain level controlled Vital Signs Assessment: post-procedure vital signs reviewed and stable Respiratory status: spontaneous breathing, nonlabored ventilation, respiratory function stable and patient connected to nasal cannula oxygen Cardiovascular status: blood pressure returned to baseline and stable Postop Assessment: no apparent nausea or vomiting Anesthetic complications: no    Last Vitals:  Vitals:   06/02/19 1614 06/02/19 1615  BP: 128/72 128/72  Pulse: 75 77  Resp: 19 (!) 22  Temp:    SpO2: 98% 98%    Last Pain:  Vitals:   06/02/19 1614  TempSrc:   PainSc: 0-No pain                 Christopher Maldonado L [name]

## 2019-06-02 NOTE — Discharge Instructions (Signed)
Diet: As you were doing prior to hospitalization   Activity: Increase activity slowly as tolerated  No lifting or driving for 6 weeks   Shower: May shower without a dressing on post op day #3, NO SOAKING in tub   Dressing: You may change your dressing on post op day #3.  Then change the dressing daily with sterile 4"x4"s gauze dressing  Or band aids.  Remain in sling except for bathing and pendulum exercises (keep arm close to body)  Weight Bearing: nonweight bearing left arm.  To prevent constipation: you may use a stool softener such as -  Colace ( over the counter) 100 mg by mouth twice a day  Drink plenty of fluids ( prune juice may be helpful) and high fiber foods  Miralax ( over the counter) for constipation as needed.   Precautions: If you experience chest pain or shortness of breath - call 911 immediately For transfer to the hospital emergency department!!  If you develop a fever greater that 101 F, purulent drainage from wound, increased redness or drainage from wound, or calf pain -- Call the office   Follow- Up Appointment: Please call for an appointment to be seen in 1 week or as previously scheduled.  [place] - [phone]    Post Anesthesia Home Care Instructions  Activity: Get plenty of rest for the remainder of the day. A responsible individual must stay with you for 24 hours following the procedure.  For the next 24 hours, DO NOT: -Drive a car -Paediatric nurse -Drink alcoholic beverages -Take any medication unless instructed by your physician -Make any legal decisions or sign important papers.  Meals: Start with liquid foods such as gelatin or soup. Progress to regular foods as tolerated. Avoid greasy, spicy, heavy foods. If nausea and/or vomiting occur, drink only clear liquids until the nausea and/or vomiting subsides. Call your physician if vomiting continues.  Special Instructions/Symptoms: Your throat may feel dry or sore from the anesthesia or  the breathing tube placed in your throat during surgery. If this causes discomfort, gargle with warm salt water. The discomfort should disappear within 24 hours.  If you had a scopolamine patch placed behind your ear for the management of post- operative nausea and/or vomiting:  1. The medication in the patch is effective for 72 hours, after which it should be removed.  Wrap patch in a tissue and discard in the trash. Wash hands thoroughly with soap and water. 2. You may remove the patch earlier than 72 hours if you experience unpleasant side effects which may include dry mouth, dizziness or visual disturbances. 3. Avoid touching the patch. Wash your hands with soap and water after contact with the patch.      Regional Anesthesia Blocks  1. Numbness or the inability to move the "blocked" extremity may last from 3-48 hours after placement. The length of time depends on the medication injected and your individual response to the medication. If the numbness is not going away after 48 hours, call your surgeon.  2. The extremity that is blocked will need to be protected until the numbness is gone and the  Strength has returned. Because you cannot feel it, you will need to take extra care to avoid injury. Because it may be weak, you may have difficulty moving it or using it. You may not know what position it is in without looking at it while the block is in effect.  3. For blocks in the legs and feet, returning to weight  bearing and walking needs to be done carefully. You will need to wait until the numbness is entirely gone and the strength has returned. You should be able to move your leg and foot normally before you try and bear weight or walk. You will need someone to be with you when you first try to ensure you do not fall and possibly risk injury.  4. Bruising and tenderness at the needle site are common side effects and will resolve in a few days.  5. Persistent numbness or new problems with  movement should be communicated to the surgeon or the [place] [phone] [place] [phone]).   Information for Discharge Teaching: EXPAREL (bupivacaine liposome injectable suspension)   Your surgeon or anesthesiologist gave you EXPAREL(bupivacaine) to help control your pain after surgery.   EXPAREL is a local anesthetic that provides pain relief by numbing the tissue around the surgical site.  EXPAREL is designed to release pain medication over time and can control pain for up to 72 hours.  Depending on how you respond to EXPAREL, you may require less pain medication during your recovery.  Possible side effects:  Temporary loss of sensation or ability to move in the area where bupivacaine was injected.  Nausea, vomiting, constipation  Rarely, numbness and tingling in your mouth or lips, lightheadedness, or anxiety may occur.  Call your doctor right away if you think you may be experiencing any of these sensations, or if you have other questions regarding possible side effects.  Follow all other discharge instructions given to you by your surgeon or nurse. Eat a healthy diet and drink plenty of water or other fluids.  If you return to the hospital for any reason within 96 hours following the administration of EXPAREL, it is important for health care providers to know that you have received this anesthetic. A teal colored band has been placed on your arm with the date, time and amount of EXPAREL you have received in order to alert and inform your health care providers. Please leave this armband in place for the full 96 hours following administration, and then you may remove the band.

## 2019-06-02 NOTE — H&P (Signed)
Christopher Maldonado is an 68 y.o. male.   Chief Complaint: left shoulder adhesive capsulitis and rotator cuff tear HPI: HISTORY: Christopher Maldonado is a 68 year old seen at the request of Dr. Farris HasKramer for nine months of significant left shoulder pain, increasing in nature, pain with overhead use and activity with night pain as well.  He has lost a significant amount of motion secondary to favoring this as well.  He had a cervical fusion by Dr. Dutch Maldonado in February 2020, which relieved his right shoulder and arm pain, but did not relieve his left shoulder pain.  He underwent an MRI on 05/06/2019 which revealed a rotator cuff tear that is not retracted, as well as impingement and adhesive capsulitis, as well as a partial labrum tear.  He is followed by Dr. Merri Brunetteandace Maldonado for his regular medical care.   Past Medical History:  Diagnosis Date  . Enlarged prostate   . Hypertension     Past Surgical History:  Procedure Laterality Date  . BACK SURGERY    . CERVICAL FUSION  12/2018  . HERNIA REPAIR  2016    Family History  Problem Relation Age of Onset  . Brain cancer Mother   . Liver cancer Mother   . Esophageal cancer Father   . Valvular heart disease Father   . Breast cancer Sister   . Hypertension Brother   . Gout Brother   . Melanoma Sister    Social History:  reports that he has quit smoking. He has never used smokeless tobacco. He reports current alcohol use of about 3.0 standard drinks of alcohol per week. He reports previous drug use.  Allergies:  Allergies  Allergen Reactions  . Amoxicillin Other (See Comments)    Liver shuts down     No medications prior to admission.    No results found for this or any previous visit (from the past 48 hour(s)). No results found.  Review of Systems  Constitutional: Negative.   HENT: Negative.   Eyes: Negative.   Respiratory: Negative.   Cardiovascular: Negative.   Gastrointestinal: Negative.   Genitourinary: Negative.   Musculoskeletal: Positive  for joint pain.  Skin: Negative.   Neurological: Negative.   Endo/Heme/Allergies: Negative.   Psychiatric/Behavioral: Negative.     Height 6' (1.829 m), weight 94.3 kg. Physical Exam  Constitutional: He is oriented to person, place, and time. He appears well-developed and well-nourished.  HENT:  Head: Normocephalic and atraumatic.  Eyes: Pupils are equal, round, and reactive to light. Conjunctivae are normal.  Neck: Neck supple.  Cardiovascular: Normal rate.  Respiratory: Effort normal.  GI: Soft.  Genitourinary:    Genitourinary Comments: Not pertinent to current symptomatology therefore not examined.   Musculoskeletal:     Comments: Examination of his left shoulder reveals forward flexion actively of 90, passively to 150 with pain and weakness.  Active abduction of 90, passive to 130 with pain and weakness.  Internal and external rotation 50 degrees with pain and weakness.  No instability.  Examination of his right shoulder reveals full range of motion without pain, weakness or instability.  Pulses 2+ and symmetric  Neurological: He is alert and oriented to person, place, and time.  Skin: Skin is warm and dry.  Psychiatric: He has a normal mood and affect. His behavior is normal.     Assessment Active Problems:   Complete rotator cuff tear of left shoulder   Adhesive capsulitis of left shoulder   Hypertension   Plan Left shoulder manipulation under anesthesia,  arthroscopy with lysis of adhesions, rotator cuff repair and sub acromial decompression and distal clavicle excision.   The risks, benefits, and possible complications of the procedure were discussed in detail with the patient.  The patient is without question.  Christopher Depaula J Alonna Bartling, PA-C 06/02/2019, 7:32 AM

## 2019-06-02 NOTE — Op Note (Signed)
NAME: Christopher Maldonado, Christopher D. MEDICAL RECORD [id number] ACCOUNT [id number] DATE OF BIRTH:1951-08-08 FACILITY: [facility] LOCATION: [facility] PHYSICIAN:Evelynne Spiers Salley SlaughterA. Oliveah Zwack, MD  OPERATIVE REPORT  DATE OF PROCEDURE:  06/02/2019  PREOPERATIVE DIAGNOSES:   1.  Left shoulder chronic traumatic rotator cuff tear. 2.  Left shoulder chronic traumatic labrum tear. 3.  Left shoulder chronic nontraumatic impingement. 4.  Left shoulder adhesive capsulitis.  POSTOPERATIVE DIAGNOSES: 1.  Left shoulder chronic traumatic rotator cuff tear. 2.  Left shoulder chronic traumatic labrum tear. 3.  Left shoulder chronic nontraumatic impingement. 4.  Left shoulder adhesive capsulitis.  PROCEDURE PERFORMED: 1.  Left shoulder exam under anesthesia followed by manipulation with arthroscopic lysis of adhesions. 2.  Left shoulder rotator cuff repair using Arthrex SwiveLock anchor x1. 3.  Left shoulder partial labrum tear, partial rotator cuff tear debridement -- extensive. 4.  Left shoulder subacromial decompression.  SURGEON:  Salvatore Marvelobert Abas Leicht, MD  ASSISTANT:  Margart SicklesJoshua Chadwell PA.  ANESTHESIA:  General.  OPERATIVE TIME:  One hour.  COMPLICATIONS:  None.  INDICATIONS:  The patient is a 68 year old gentleman who has had 9 to 10 months of increasing left shoulder pain with decreased range of motion.  His exam and MRI has revealed a rotator cuff tear with adhesive capsulitis with partial labrum tear with  impingement.  He has failed conservative care and is now to undergo arthroscopy and repair.  DESCRIPTION OF PROCEDURE:  The patient was brought to the operating room on 06/02/2019 after an interscalene block was placed in the holding room by Anesthesia.  He was placed on the operating table in supine position.  He received antibiotics  preoperatively for prophylaxis.  After being placed under general anesthesia, his left shoulder was examined.  He had forward flexion 120, abduction 120, internal and external rotation of  50 degrees.  A manipulation was carried out, breaking up adhesions  and improving range of motion to 85-90% in all planes.  Shoulder remained stable.  He was then placed in a beach chair position and his shoulder and arm was prepped and using sterile DuraPrep and draped using sterile technique.  Time-out procedure was  called.  The correct left shoulder identified.  Initially through a posterior arthroscopic portal, the arthroscope with a pump attached was placed in through an anterior portal, an arthroscopic probe was placed.  On initial inspection, the articular  cartilage in the glenohumeral joint was intact.  There was a significant hemarthrosis secondary to the manipulation, which was evacuated.  He had significant adhesions anterior and inferior and posterior, which were lysed and cauterized.  He had tearing  of the anterior, superior and posterior labrum, 25-30%, which was debrided.  The anterior inferior glenohumeral ligament complex was intact.  The biceps tendon anchor was intact and the biceps tendon was intact.  He had a complete tear of the  supraspinatus and partial tear of the infraspinatus, which was debrided.  The subscapularis and teres minor were intact.  Subacromial space was entered and a lateral arthroscopic portal was made.  Large amount of bursitis was resected.  Impingement was  noted as the undersurface of the acromion was digging into the rotator cuff tear and a subacromial decompression was carried out removing 6-8 mm of the undersurface of the anterior, anterolateral and anteromedial acromion and CA ligament release carried  out as well.  [facility] was not pathologic and thus was not resected.  At this point, the rotator cuff was repaired primarily with one Arthrex SwiveLock anchor with a fiber tape using  mattress suture technique and a fiber cinch.  All these sutures  were placed and the SwiveLock anchor and it was deployed in the lateral aspect of the greater tuberosity with  firm and anatomic fixation.  The shoulder could be brought through a full range of motion with no impingement on the repair.  At this point, it  was felt that all pathology had been satisfactorily addressed.  The instruments were removed.  Portals closed with 3-0 nylon suture.  Sterile dressings and a sling applied and the patient awakened and taken to recovery room in stable condition.   FOLLOWUP CARE:  The patient will be followed as an outpatient on oxycodone and tizanidine with early aggressive physical therapy.  He will be seen back in the office in a week for sutures out and followup.  AN/NUANCE  D:06/02/2019 T:06/02/2019 JOB:[id number]

## 2019-06-02 NOTE — Progress Notes (Signed)
Assisted Dr. Woodrum with left, ultrasound guided, interscalene  block. Side rails up, monitors on throughout procedure. See vital signs in flow sheet. Tolerated Procedure well. 

## 2019-06-02 NOTE — Anesthesia Procedure Notes (Signed)
Anesthesia Regional Block: Interscalene brachial plexus block   Pre-Anesthetic Checklist: ,, timeout performed, Correct Patient, Correct Site, Correct Laterality, Correct Procedure, Correct Position, site marked, Risks and benefits discussed,  Surgical consent,  Pre-op evaluation,  At surgeon's request and post-op pain management  Laterality: Left  Prep: Maximum Sterile Barrier Precautions used, chloraprep       Needles:  Injection technique: Single-shot  Needle Type: Echogenic Stimulator Needle     Needle Length: 5cm  Needle Gauge: 22     Additional Needles:   Procedures:,,,, ultrasound used (permanent image in chart),,,,  Narrative:  Start time: 06/02/2019 1:00 PM End time: 06/02/2019 1:10 PM Injection made incrementally with aspirations every 5 mL.  Performed by: Personally  Anesthesiologist: Freddrick March, MD  Additional Notes: Monitors applied. No increased pain on injection. No increased resistance to injection. Injection made in 5cc increments. Good needle visualization. Patient tolerated procedure well.

## 2019-06-02 NOTE — Interval H&P Note (Signed)
History and Physical Interval Note:  06/02/2019 2:07 PM  Christopher Maldonado  has presented today for surgery, with the diagnosis of LEFT SHOULDER ADHESIVE CAPCULITIS, [place].  The various methods of treatment have been discussed with the patient and family. After consideration of risks, benefits and other options for treatment, the patient has consented to  Procedure(s) with comments: LEFT SHOULDER ARTHROSCOPY WITH ROTATOR CUFF REPAIR, LYSIS RESECTION OF ADHESIONS WITH MANIPULATION, EXTENSIVE DEBRIDEMENT (Left) - PRE/POST OP SCALENE as a surgical intervention.  The patient's history has been reviewed, patient examined, no change in status, stable for surgery.  I have reviewed the patient's chart and labs.  Questions were answered to the patient's satisfaction.     Christopher Maldonado

## 2019-06-02 NOTE — Anesthesia Preprocedure Evaluation (Addendum)
Anesthesia Evaluation  Patient identified by MRN, date of birth, ID band Patient awake    Reviewed: Allergy & Precautions, NPO status , Patient's Chart, lab work & pertinent test results  Airway Mallampati: II  TM Distance: >3 FB Neck ROM: Full    Dental  (+) Missing   Pulmonary former smoker,    Pulmonary exam normal breath sounds clear to auscultation       Cardiovascular hypertension, Pt. on medications Normal cardiovascular exam Rhythm:Regular Rate:Normal  ECG: EKG 05/28/2019: Normal sinus rhythm at 83 bpm, left atrial enlargement, left axis deviation, left anterior fasicular block  ECHO: Echocardiogram 05/29/2019: Normal LV systolic function with EF 57%. Left ventricle cavity is normal in size. Moderate concentric hypertrophy of the left ventricle. Normal global wall motion. Doppler evidence of grade I (impaired) diastolic  dysfunction, normal LAP. Calculated EF 57%. Mild (Grade I) mitral regurgitation. Normal right atrial pressure.   Neuro/Psych  C-spine cleared negative psych ROS   GI/Hepatic negative GI ROS, Neg liver ROS,   Endo/Other  negative endocrine ROS  Renal/GU negative Renal ROS     Musculoskeletal negative musculoskeletal ROS (+)   Abdominal   Peds  Hematology negative hematology ROS (+)   Anesthesia Other Findings LEFT SHOULDER ADHESIVE CAPCULITIS, ROTATOR CUFF STRAIN  Reproductive/Obstetrics                            Anesthesia Physical Anesthesia Plan  ASA: II  Anesthesia Plan: Regional and General   Post-op Pain Management: GA combined w/ Regional for post-op pain   Induction: Intravenous  PONV Risk Score and Plan: 2 and Ondansetron, Dexamethasone, Midazolam and Treatment may vary due to age or medical condition  Airway Management Planned: Oral ETT  Additional Equipment:   Intra-op Plan:   Post-operative Plan: Extubation in OR  Informed Consent: I  have reviewed the patients History and Physical, chart, labs and discussed the procedure including the risks, benefits and alternatives for the proposed anesthesia with the patient or authorized representative who has indicated his/her understanding and acceptance.     Dental advisory given  Plan Discussed with: CRNA  Anesthesia Plan Comments:        Anesthesia Quick Evaluation

## 2019-06-02 NOTE — Progress Notes (Signed)
BP continued to be elevated today. Patient was started on amlodipine 5 mg daily. Will reevaluate in 6 weeks for follow up.

## 2019-06-03 ENCOUNTER — Encounter (HOSPITAL_BASED_OUTPATIENT_CLINIC_OR_DEPARTMENT_OTHER): Payer: Self-pay | Admitting: Orthopedic Surgery

## 2019-06-03 DIAGNOSIS — M75122 Complete rotator cuff tear or rupture of left shoulder, not specified as traumatic: Secondary | ICD-10-CM | POA: Diagnosis not present

## 2019-06-03 DIAGNOSIS — M25612 Stiffness of left shoulder, not elsewhere classified: Secondary | ICD-10-CM | POA: Diagnosis not present

## 2019-06-03 DIAGNOSIS — M6281 Muscle weakness (generalized): Secondary | ICD-10-CM | POA: Diagnosis not present

## 2019-06-03 DIAGNOSIS — M25512 Pain in left shoulder: Secondary | ICD-10-CM | POA: Diagnosis not present

## 2019-06-04 DIAGNOSIS — M75122 Complete rotator cuff tear or rupture of left shoulder, not specified as traumatic: Secondary | ICD-10-CM | POA: Diagnosis not present

## 2019-06-04 DIAGNOSIS — M6281 Muscle weakness (generalized): Secondary | ICD-10-CM | POA: Diagnosis not present

## 2019-06-04 DIAGNOSIS — M7502 Adhesive capsulitis of left shoulder: Secondary | ICD-10-CM | POA: Diagnosis not present

## 2019-06-04 DIAGNOSIS — M25612 Stiffness of left shoulder, not elsewhere classified: Secondary | ICD-10-CM | POA: Diagnosis not present

## 2019-06-09 DIAGNOSIS — M25512 Pain in left shoulder: Secondary | ICD-10-CM | POA: Diagnosis not present

## 2019-06-09 DIAGNOSIS — M6281 Muscle weakness (generalized): Secondary | ICD-10-CM | POA: Diagnosis not present

## 2019-06-09 DIAGNOSIS — M25612 Stiffness of left shoulder, not elsewhere classified: Secondary | ICD-10-CM | POA: Diagnosis not present

## 2019-06-09 DIAGNOSIS — M75122 Complete rotator cuff tear or rupture of left shoulder, not specified as traumatic: Secondary | ICD-10-CM | POA: Diagnosis not present

## 2019-06-10 DIAGNOSIS — M25612 Stiffness of left shoulder, not elsewhere classified: Secondary | ICD-10-CM | POA: Diagnosis not present

## 2019-06-11 DIAGNOSIS — M75122 Complete rotator cuff tear or rupture of left shoulder, not specified as traumatic: Secondary | ICD-10-CM | POA: Diagnosis not present

## 2019-06-11 DIAGNOSIS — M6281 Muscle weakness (generalized): Secondary | ICD-10-CM | POA: Diagnosis not present

## 2019-06-11 DIAGNOSIS — M25512 Pain in left shoulder: Secondary | ICD-10-CM | POA: Diagnosis not present

## 2019-06-11 DIAGNOSIS — M25612 Stiffness of left shoulder, not elsewhere classified: Secondary | ICD-10-CM | POA: Diagnosis not present

## 2019-06-13 DIAGNOSIS — M75122 Complete rotator cuff tear or rupture of left shoulder, not specified as traumatic: Secondary | ICD-10-CM | POA: Diagnosis not present

## 2019-06-13 DIAGNOSIS — M25612 Stiffness of left shoulder, not elsewhere classified: Secondary | ICD-10-CM | POA: Diagnosis not present

## 2019-06-13 DIAGNOSIS — M6281 Muscle weakness (generalized): Secondary | ICD-10-CM | POA: Diagnosis not present

## 2019-06-13 DIAGNOSIS — M25512 Pain in left shoulder: Secondary | ICD-10-CM | POA: Diagnosis not present

## 2019-06-28 DEATH — deceased

## 2019-06-30 ENCOUNTER — Encounter: Payer: Self-pay | Admitting: Cardiology

## 2019-06-30 ENCOUNTER — Telehealth: Payer: Self-pay | Admitting: Cardiology

## 2019-06-30 NOTE — Telephone Encounter (Signed)
Called patients wife to express my condolences on patients passing. Wife explained events that took place. Discussed many etiologies including DVT with PE, ACS, etc. Patients wife appreciated my call.

## 2019-07-02 ENCOUNTER — Ambulatory Visit: Payer: BLUE CROSS/BLUE SHIELD | Admitting: Cardiology

## 2019-12-19 IMAGING — MR MR CERVICAL SPINE W/O CM
4 of 5 series · 20 of 48 positions shown · non-contrast
Comparison: None.

CLINICAL DATA: Pain and weakness in the right side of neck and
right arm for 4 months.

EXAM:
MRI CERVICAL SPINE WITHOUT CONTRAST
TECHNIQUE: Multiplanar, multisequence MR imaging of the cervical spine was
performed. No intravenous contrast was administered.

[Series 2: T2 · sagittal · 3.0mm · 0.41mm/px · 7 of 17 slices shown (1 of 3)]
[im 1/17]
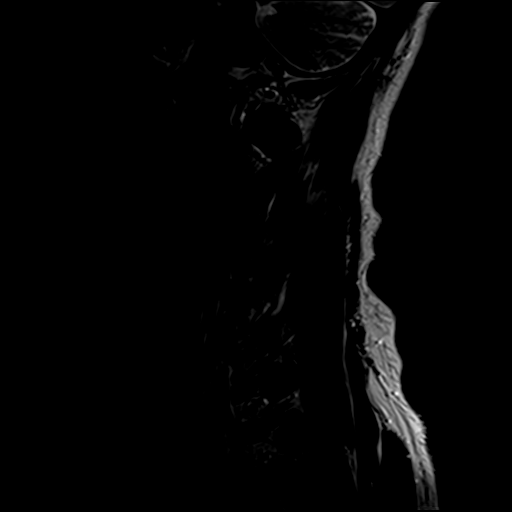
[im 3/17]
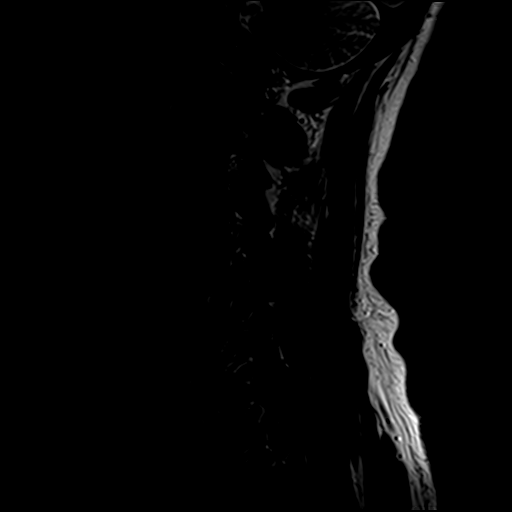
[im 6/17]
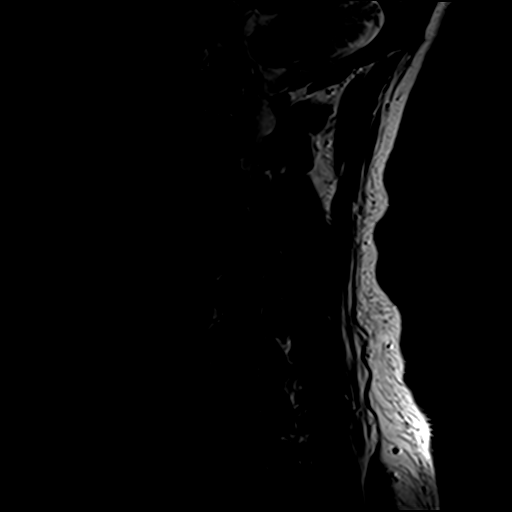
[im 9/17]
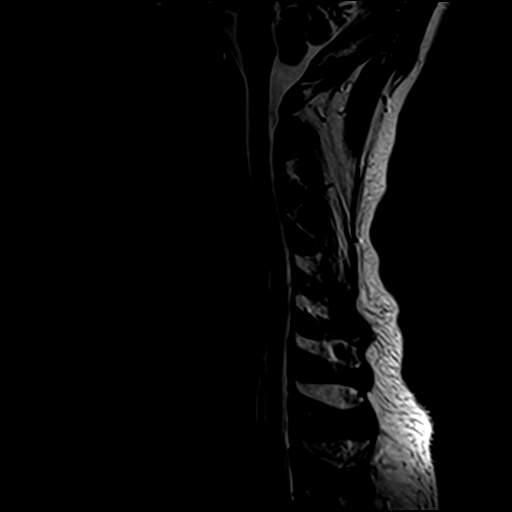
[im 11/17]
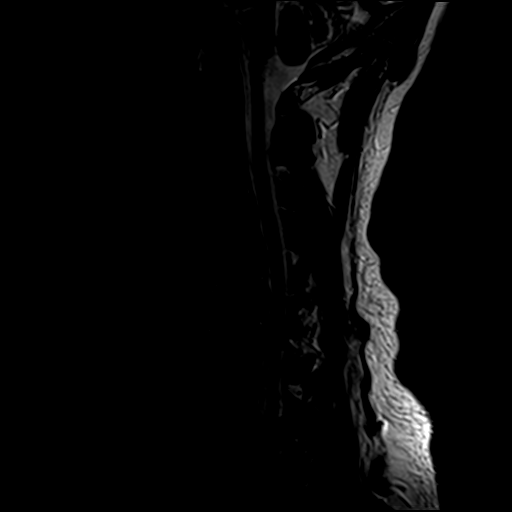
[im 14/17]
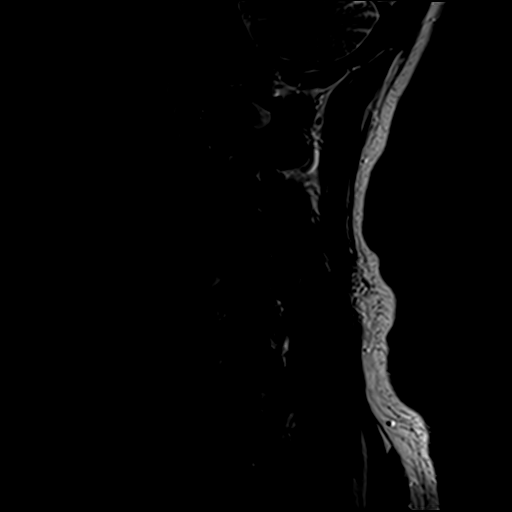
[im 17/17]
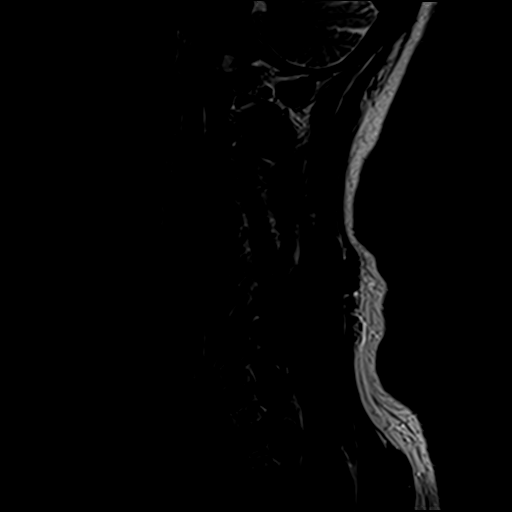

[Series 3: T1 · sagittal · 3.0mm · 0.41mm/px · 3 of 17 slices shown]
[im 3/17]
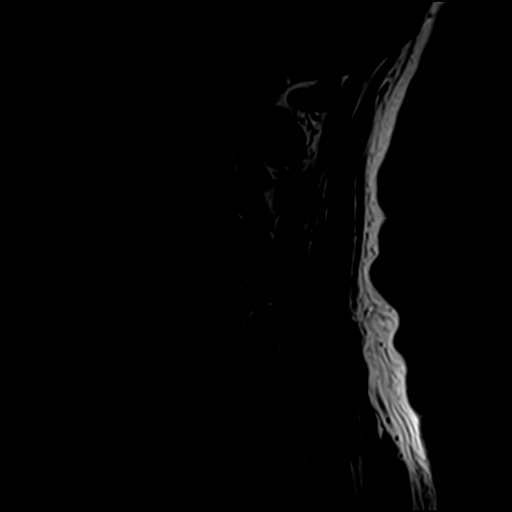
[im 9/17]
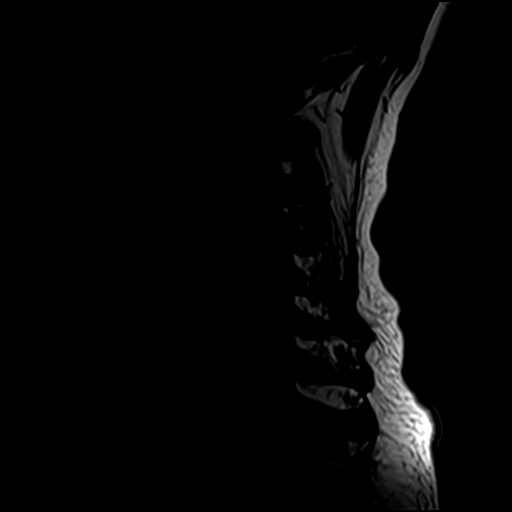
[im 14/17]
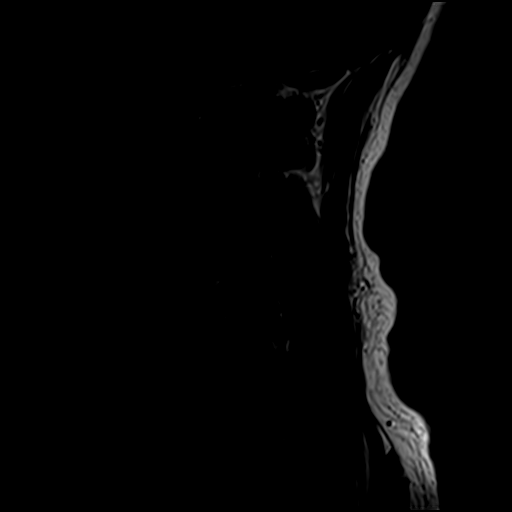

[Series 5: T2 · axial · 3.0mm · 0.39mm/px · z∈[-35,+48]mm · 7 of 28 slices shown (2 of 3)]
[im 1/28]
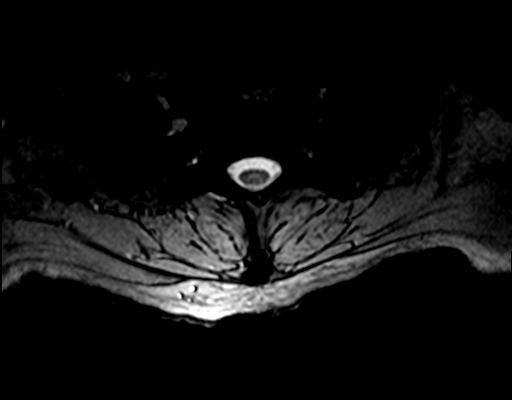
[im 5/28]
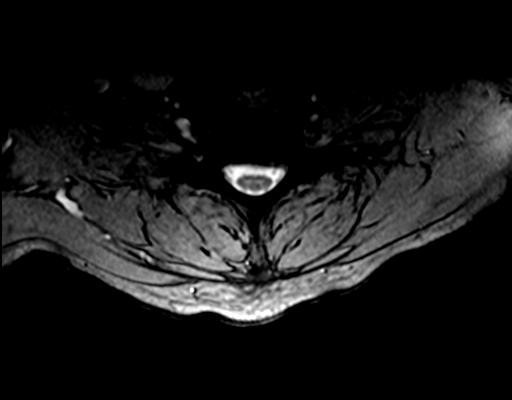
[im 10/28]
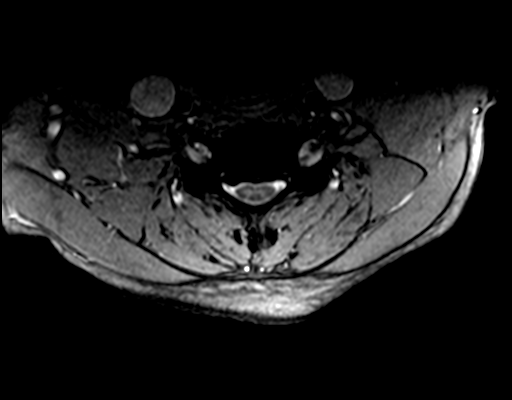
[im 12/28]
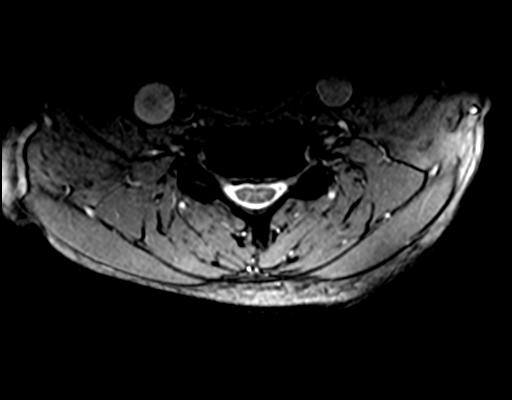
[im 14/28]
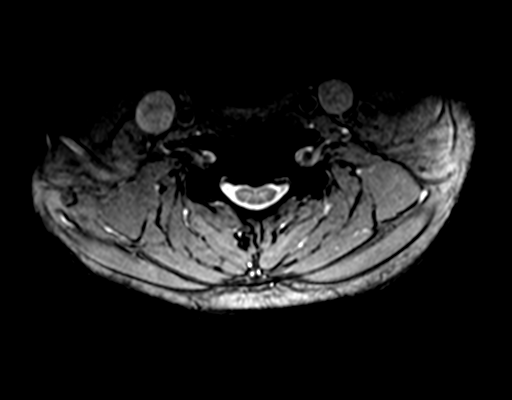
[im 16/28]
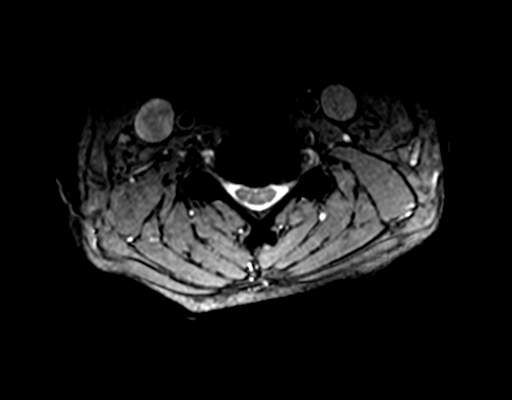
[im 23/28]
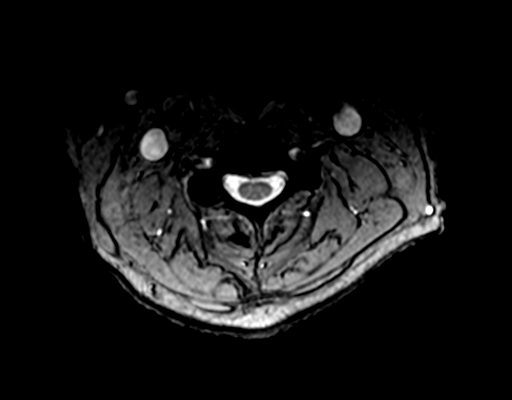

[Series 6: T2 · axial · 3.0mm · 0.39mm/px · z∈[-20,+47]mm · 3 of 28 slices shown (3 of 3)]
[im 5/28]
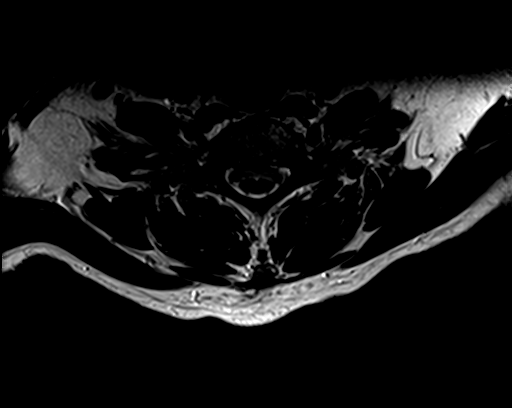
[im 14/28]
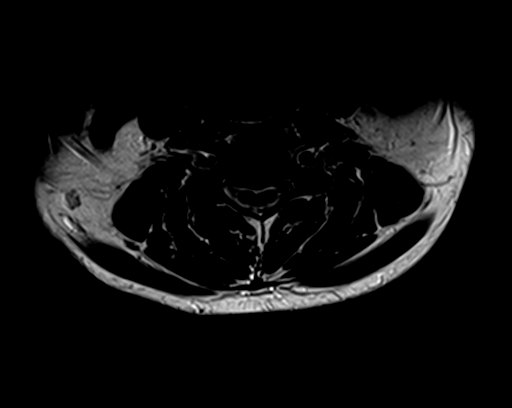
[im 23/28]
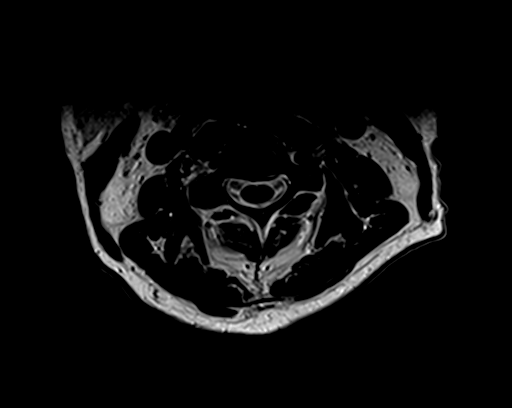

[20 of 48 positions shown; findings below may reference images not displayed]

FINDINGS: Alignment: Straightening and slight reversal of the normal cervical
lordosis which could be due to positioning, muscle spasm or pain.

Vertebrae: Endplate reactive changes mainly at C6-7 but no worrisome
bone lesions or fracture.

Cord: No cord lesions or syrinx.

Posterior Fossa, vertebral arteries, paraspinal tissues: Mild
degenerative changes at C1-2 with early pannus formation but no mass
effect on the upper cervical cord.

Disc levels:

C2-3: No significant findings.

C3-4: Bulging annulus and mild osteophytic ridging with slight
flattening of the ventral thecal sac and slight narrowing the
ventral CSF space. There is a left-sided disc osteophyte complex
contributing to mild/moderate left foraminal stenosis.

C4-5: Broad-based disc protrusion, osteophytic ridging and uncinate
spurring. There is mass effect on the ventral thecal sac and
narrowing the ventral CSF space. There is also a sizable left
foraminal disc osteophyte complex with moderate left foraminal
stenosis possibly affecting the left C5 nerve root. There is also
mild right foraminal stenosis.

C5-6: Bulging annulus, osteophytic ridging and uncinate spurring
with flattening of the ventral thecal sac, narrowing the ventral CSF
space and mild to moderate bilateral foraminal stenosis.

C6-7: Moderate degenerate disc disease with a bulging annulus,
osteophytic ridging and uncinate spurring. Mass effect on the
ventral thecal sac with narrowing the ventral CSF space and
mild/moderate bilateral foraminal stenosis.

C7-T1: Small focal left paracentral/medial foraminal disc protrusion
with mild mass effect on the thecal sac and possible irritation of
the left C8 nerve root.
IMPRESSION: 1. Degenerative cervical spondylosis with multilevel disc disease
and facet disease.
2. Multilevel multifactorial spinal and foraminal stenosis as
detailed above at the individual levels.
